# Patient Record
Sex: Female | Born: 1985 | Race: White | Hispanic: No | Marital: Single | Smoking: Current every day smoker
Health system: Southern US, Community
[De-identification: ages and names within clinical notes are randomized; demographics above are authoritative.]

---

## 2005-07-14 ENCOUNTER — Emergency Department: Payer: Self-pay | Admitting: Emergency Medicine

## 2007-06-25 ENCOUNTER — Emergency Department: Payer: Self-pay | Admitting: Emergency Medicine

## 2007-06-29 ENCOUNTER — Emergency Department: Payer: Self-pay | Admitting: Emergency Medicine

## 2007-12-23 ENCOUNTER — Emergency Department: Payer: Self-pay | Admitting: Emergency Medicine

## 2016-08-04 ENCOUNTER — Encounter (HOSPITAL_COMMUNITY): Payer: Self-pay | Admitting: *Deleted

## 2016-08-04 ENCOUNTER — Emergency Department (HOSPITAL_COMMUNITY)
Admission: EM | Admit: 2016-08-04 | Discharge: 2016-08-04 | Disposition: A | Discharge status: Home / Self Care | Payer: Self-pay | Attending: Emergency Medicine | Admitting: Emergency Medicine

## 2016-08-04 ENCOUNTER — Emergency Department (HOSPITAL_COMMUNITY): Payer: Self-pay

## 2016-08-04 DIAGNOSIS — F17200 Nicotine dependence, unspecified, uncomplicated: Secondary | ICD-10-CM | POA: Insufficient documentation

## 2016-08-04 DIAGNOSIS — R300 Dysuria: Secondary | ICD-10-CM | POA: Insufficient documentation

## 2016-08-04 DIAGNOSIS — M545 Low back pain: Secondary | ICD-10-CM | POA: Insufficient documentation

## 2016-08-04 LAB — URINALYSIS, ROUTINE W REFLEX MICROSCOPIC
Bilirubin Urine: NEGATIVE
Glucose, UA: NEGATIVE mg/dL
Ketones, ur: NEGATIVE mg/dL
Leukocytes, UA: NEGATIVE
Nitrite: NEGATIVE
Protein, ur: NEGATIVE mg/dL
Specific Gravity, Urine: 1.005 (ref 1.005–1.030)
pH: 8 (ref 5.0–8.0)

## 2016-08-04 LAB — PREGNANCY, URINE: Preg Test, Ur: NEGATIVE

## 2016-08-04 MED ORDER — IBUPROFEN 800 MG PO TABS: 800.0000 mg | ORAL_TABLET | Freq: Three times a day (TID) | ORAL | 0 refills | Status: DC

## 2016-08-04 MED ORDER — CYCLOBENZAPRINE HCL 10 MG PO TABS: 10.0000 mg | ORAL_TABLET | Freq: Two times a day (BID) | ORAL | 0 refills | Status: DC | PRN

## 2016-08-04 NOTE — ED Notes (Signed)
Patient transported to X-ray 

## 2016-08-04 NOTE — ED Triage Notes (Signed)
Pt reports bilateral lower back pain that radiates all the way across and down bilateral legs. Denies injury. Denies urinary symptoms. Ambulatory at triage.

## 2016-08-04 NOTE — ED Notes (Signed)
Pt is in stable condition upon d/c and ambulates from ED. 

## 2016-08-04 NOTE — Discharge Instructions (Signed)
Medications: ibuprofen, Flexeril  Treatment: Take Flexeril twice daily as needed for muscle pain and spasms. Do not drive or operate machinery when taking this medication. Take ibuprofen every 8 hours for your pain. Use heat and ice alternating 20 minutes on, 20 minutes off. Attempts the stretches attached as tolerated 1-2 times daily.  Follow-up: Please follow-up and establish primary care provider as soon as possible if your symptoms are not improving. Please return the emergency department if he develop any new or worsening symptoms.

## 2016-08-04 NOTE — ED Provider Notes (Signed)
[facility]-EMERGENCY DEPT Provider Note   CSN: [id number] Arrival date & time: 08/04/16  1008  By signing my name below, I, Placido SouLogan Joldersma, attest that this documentation has been prepared under the direction and in the presence of [employer] Electric[employer]andra Nerida Boivin, PA-C.  Electronically Signed: Placido SouLogan Joldersma, ED Scribe. 08/04/16. 10:35 AM.    History   Chief Complaint Chief Complaint  Patient presents with  . Back Pain    HPI HPI Comments: Aimee Perez is a 30 y.o. female who presents to the Emergency Department complaining of moderate, waxing and waning, lower back pain x 5 days. She describes her pain as a throbbing and states it radiates through her bilateral gluteal region and down her posterior legs. Pt states she experienced mild urinary pressure at the onset of her back pain as well as increased urinary frequency. Her pain worsens with movement and ambulation and is worst when lying flat. She is taking 600-800 mg ibuprofen prn which provides mild short term relief. Pt denies a h/o CA or IVDA and denies any recent surgeries. Pt denies any recent heavy lifting or trauma to the region. She denies any saddle anesthesia, bowel/bladder incontinence, dysuria, diaphoresis, fevers, chills, CP, SOB, abdominal pain, nausea, vomiting, diarrhea of other associated symptoms at this time.   The history is provided by the patient and medical records. No language interpreter was used.    History reviewed. No pertinent past medical history.  There are no active problems to display for this patient.   History reviewed. No pertinent surgical history.  OB History    No data available       Home Medications    Prior to Admission medications   Medication Sig Start Date End Date Taking? Authorizing Provider  cyclobenzaprine (FLEXERIL) 10 MG tablet Take 1 tablet (10 mg total) by mouth 2 (two) times daily as needed for muscle spasms. 08/04/16   Emi HolesAlexandra M Liela Rylee, PA-C  ibuprofen (ADVIL,MOTRIN) 800 MG tablet  Take 1 tablet (800 mg total) by mouth 3 (three) times daily. 08/04/16   Emi HolesAlexandra M Yvonne Petite, PA-C    Family History History reviewed. No pertinent family history.  Social History Social History  Substance Use Topics  . Smoking status: Current Every Day Smoker  . Smokeless tobacco: Not on file  . Alcohol use Yes     Comment: occ     Allergies   Patient has no known allergies.   Review of Systems Review of Systems  Constitutional: Negative for chills and fever.  HENT: Negative for facial swelling and sore throat.   Respiratory: Negative for shortness of breath.   Cardiovascular: Negative for chest pain.  Gastrointestinal: Negative for abdominal pain, nausea and vomiting.  Genitourinary: Positive for frequency. Negative for difficulty urinating and dysuria.  Musculoskeletal: Positive for back pain and myalgias. Negative for neck pain and neck stiffness.  Skin: Negative for rash and wound.  Neurological: Negative for headaches.  Psychiatric/Behavioral: The patient is not nervous/anxious.    Physical Exam Updated Vital Signs BP (!) 129/103 (BP Location: Left Arm)   Pulse 78   Temp 98.8 F (37.1 C) (Oral)   Resp 17   Ht 5\' 6"  (1.676 m)   Wt 77.1 kg   LMP 07/21/2016   SpO2 100%   BMI 27.44 kg/m   Physical Exam  Constitutional: She appears well-developed and well-nourished. No distress.  HENT:  Head: Normocephalic and atraumatic.  Mouth/Throat: Oropharynx is clear and moist. No oropharyngeal exudate.  Eyes: Conjunctivae are normal. Pupils are equal, round,  and reactive to light. Right eye exhibits no discharge. Left eye exhibits no discharge. No scleral icterus.  Neck: Normal range of motion. Neck supple. No thyromegaly present.  Cardiovascular: Normal rate, regular rhythm, normal heart sounds and intact distal pulses.  Exam reveals no gallop and no friction rub.   No murmur heard. Pulmonary/Chest: Effort normal and breath sounds normal. No stridor. No respiratory  distress. She has no wheezes. She has no rales.  Abdominal: Soft. Bowel sounds are normal. She exhibits no distension. There is no tenderness. There is no rebound and no guarding.  Musculoskeletal: She exhibits tenderness. She exhibits no edema.       Lumbar back: She exhibits tenderness. She exhibits no bony tenderness.  TTP to bilateral lumbar musculature and bilateral gluteal musculature. Pain worsens with lumbar flexion and extension. Good ROM.   Lymphadenopathy:    She has no cervical adenopathy.  Neurological: She is alert. She has normal strength. No sensory deficit. Coordination normal.  Reflex Scores:      Patellar reflexes are 2+ on the right side and 2+ on the left side. Strength and sensation intact in the BLEs  Skin: Skin is warm and dry. No rash noted. She is not diaphoretic. No pallor.  Psychiatric: She has a normal mood and affect.  Nursing note and vitals reviewed.  ED Treatments / Results  Labs (all labs ordered are listed, but only abnormal results are displayed) Labs Reviewed  URINALYSIS, ROUTINE W REFLEX MICROSCOPIC - Abnormal; Notable for the following:       Result Value   Hgb urine dipstick SMALL (*)    Bacteria, UA RARE (*)    Squamous Epithelial / LPF 0-5 (*)    All other components within normal limits  URINE CULTURE  PREGNANCY, URINE    EKG  EKG Interpretation None       Radiology Dg Lumbar Spine Complete  Result Date: 08/04/2016 CLINICAL DATA:  Back pain EXAM: LUMBAR SPINE - COMPLETE 4+ VIEW COMPARISON:  None. FINDINGS: Five views of the lumbar spine submitted. No acute fracture or subluxation. Alignment and vertebral body heights are preserved. Minimal anterior spurring lower endplate of L3 and upper endplate of L5 vertebral body. Mild disc space flattening at L5-S1 level. IMPRESSION: No acute fracture or subluxation.  Minimal degenerative changes. Electronically Signed   By: Natasha MeadLiviu  Pop M.D.   On: 08/04/2016 11:37    Procedures Procedures    DIAGNOSTIC STUDIES: Oxygen Saturation is 100% on RA, normal by my interpretation.    COORDINATION OF CARE: 10:33 AM Discussed next steps with pt. Pt verbalized understanding and is agreeable with the plan.    Medications Ordered in ED Medications - No data to display   Initial Impression / Assessment and Plan / ED Course  I have reviewed the triage vital signs and the nursing notes.  Pertinent labs & imaging results that were available during my care of the patient were reviewed by me and considered in my medical decision making (see chart for details).  Clinical Course     Patient with back pain.  No neurological deficits and normal neuro exam.  Patient is ambulatory.  No loss of bowel or bladder control.  No concern for cauda equina.  No fever, night sweats, weight loss, h/o cancer, IVDA, no recent procedure to back. UA shows small hematuria, rare bacteria. Urine culture sent. Supportive care and return precaution discussed. Patient understands and agrees with plan. Appears safe for discharge at this time. Follow up and establish  care with PCP. Patient vitals stable throughout ED course and discharged in satisfactory condition.   Final Clinical Impressions(s) / ED Diagnoses   Final diagnoses:  Acute bilateral low back pain, with sciatica presence unspecified   I personally performed the services described in this documentation, which was scribed in my presence. The recorded information has been reviewed and is accurate.   New Prescriptions New Prescriptions   CYCLOBENZAPRINE (FLEXERIL) 10 MG TABLET    Take 1 tablet (10 mg total) by mouth 2 (two) times daily as needed for muscle spasms.   IBUPROFEN (ADVIL,MOTRIN) 800 MG TABLET    Take 1 tablet (800 mg total) by mouth 3 (three) times daily.     Emi Holes, PA-C 08/04/16 1158    Benjiman Core, MD 08/04/16 [phone]

## 2016-08-05 LAB — URINE CULTURE: Special Requests: NORMAL

## 2018-04-28 IMAGING — CR DG LUMBAR SPINE COMPLETE 4+V
5 series · 5 of 5 positions shown · non-contrast
Comparison: None.

CLINICAL DATA: Back pain

EXAM:
LUMBAR SPINE - COMPLETE 4+ VIEW

[l-spine ap]
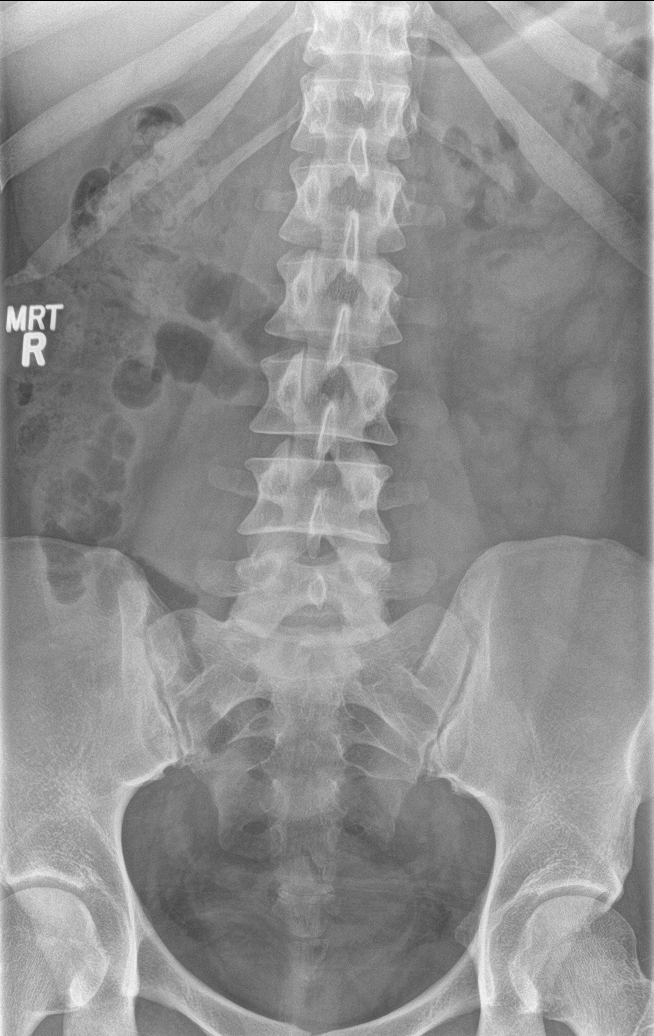

[l-spine obl (1 of 2)]
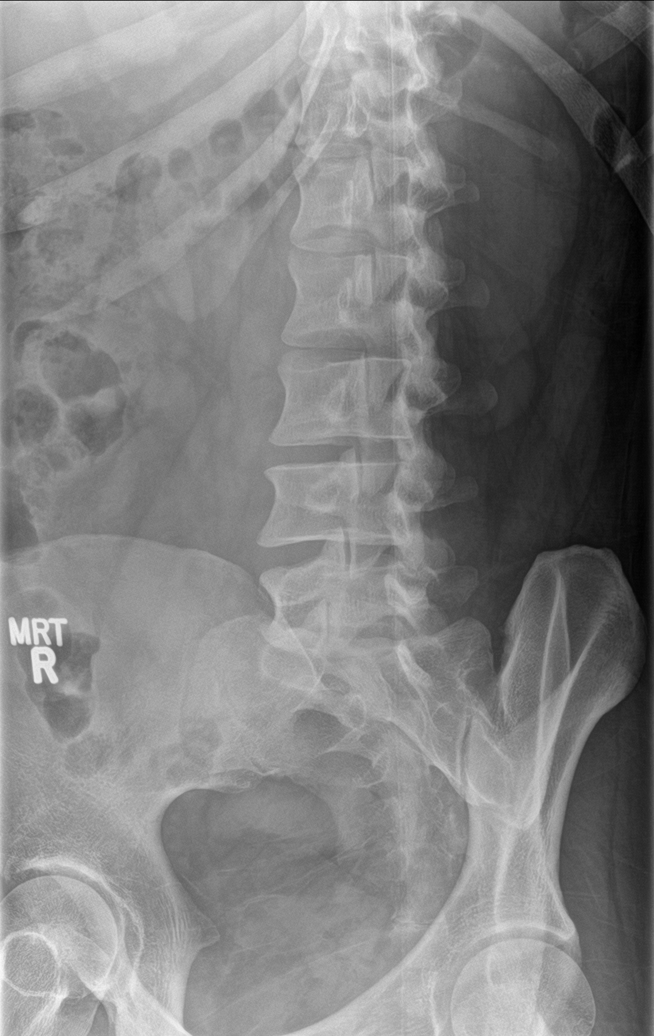

[l-spine obl (2 of 2)]
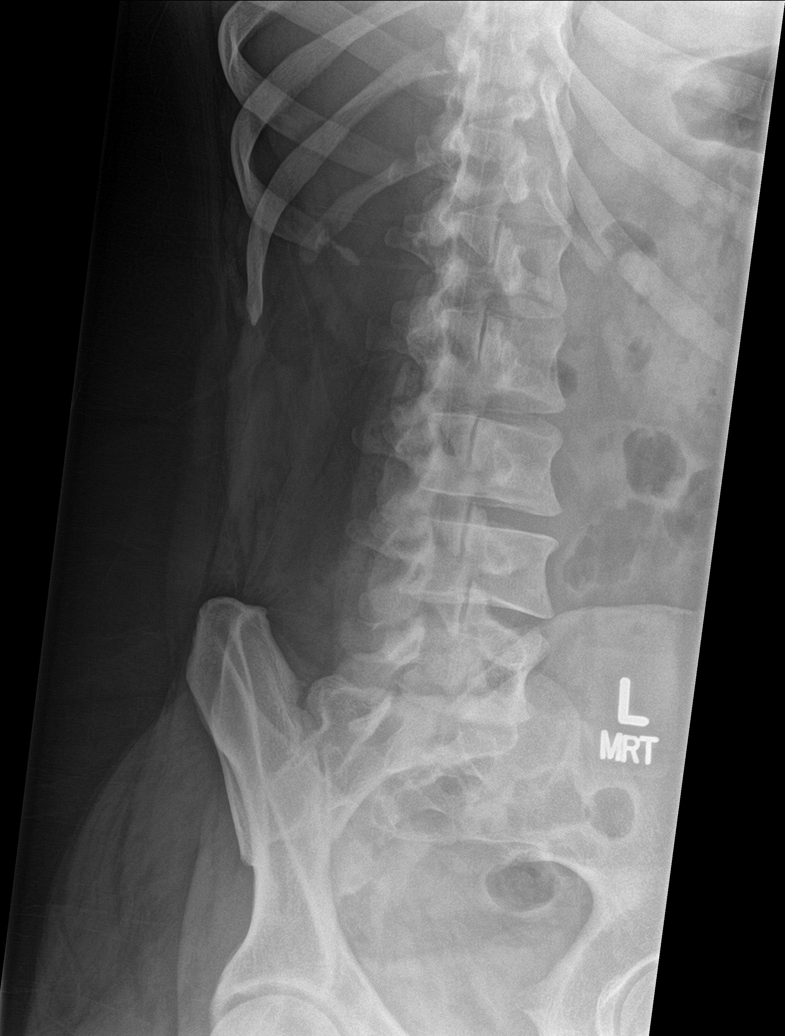

[l-spine lat]
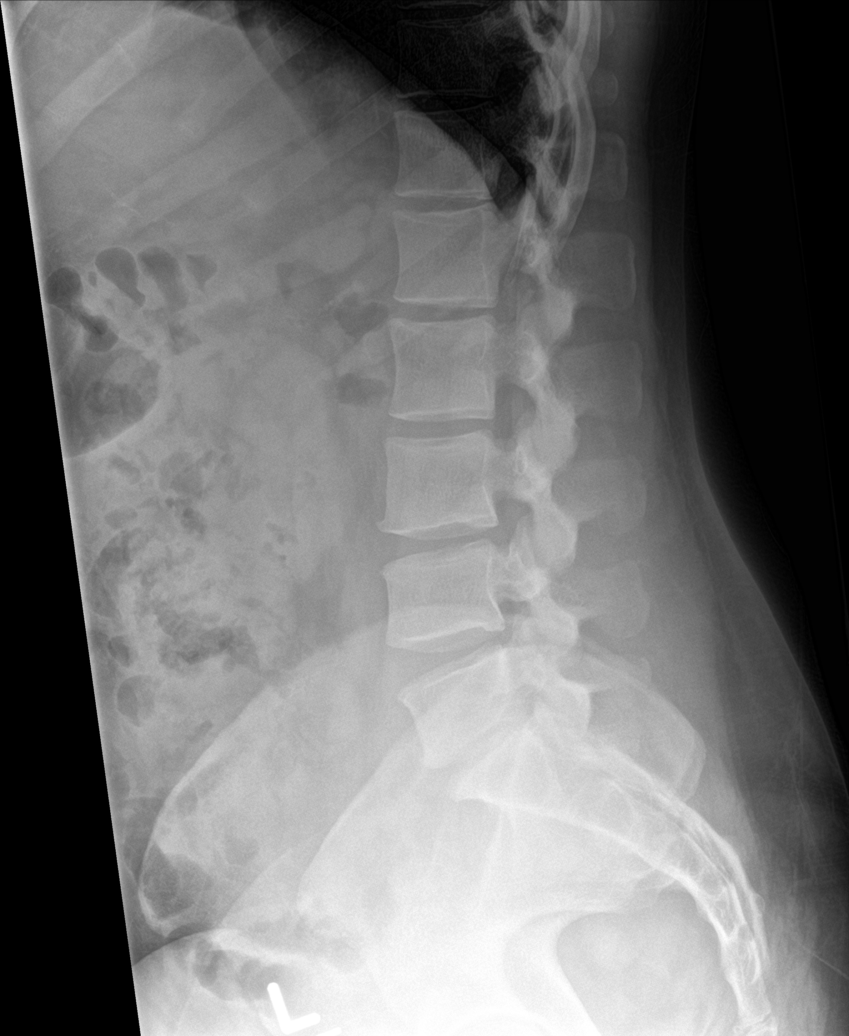

[l-spine spot]
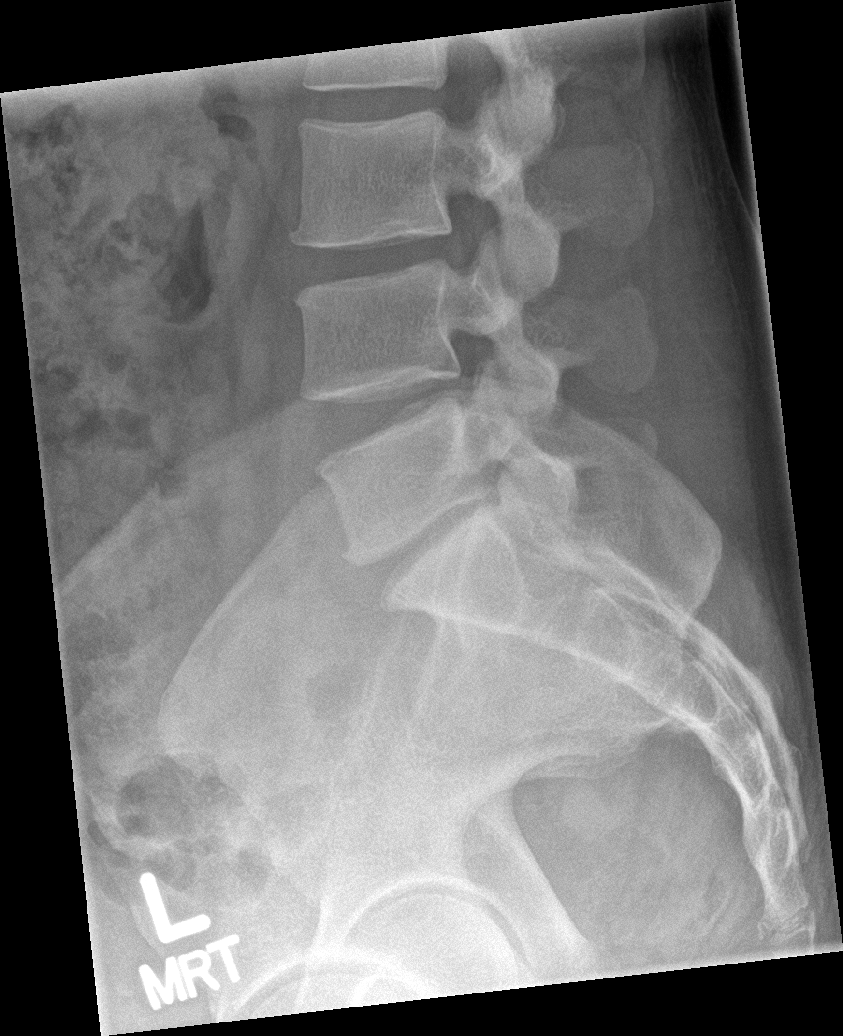

[5 of 5 positions shown; findings below may reference images not displayed]

FINDINGS: Five views of the lumbar spine submitted. No acute fracture or
subluxation. Alignment and vertebral body heights are preserved.
Minimal anterior spurring lower endplate of L3 and upper endplate of
L5 vertebral body. Mild disc space flattening at L5-S1 level.
IMPRESSION: No acute fracture or subluxation.  Minimal degenerative changes.

## 2020-04-25 ENCOUNTER — Other Ambulatory Visit: Payer: Self-pay

## 2020-04-25 ENCOUNTER — Ambulatory Visit
Admission: EM | Admit: 2020-04-25 | Discharge: 2020-04-25 | Disposition: A | Discharge status: Home / Self Care | Payer: Self-pay | Attending: Family Medicine | Admitting: Family Medicine

## 2020-04-25 ENCOUNTER — Ambulatory Visit (HOSPITAL_COMMUNITY): Admit: 2020-04-25 | Discharge status: Absent without leave | Payer: Self-pay

## 2020-04-25 DIAGNOSIS — R109 Unspecified abdominal pain: Secondary | ICD-10-CM

## 2020-04-25 DIAGNOSIS — R822 Biliuria: Secondary | ICD-10-CM

## 2020-04-25 LAB — POCT URINALYSIS DIP (MANUAL ENTRY)
Bilirubin, UA: NEGATIVE
Glucose, UA: NEGATIVE mg/dL
Nitrite, UA: NEGATIVE
Protein Ur, POC: NEGATIVE mg/dL
Spec Grav, UA: 1.02 (ref 1.010–1.025)
Urobilinogen, UA: 0.2 E.U./dL
pH, UA: 6.5 (ref 5.0–8.0)

## 2020-04-25 MED ORDER — IBUPROFEN 800 MG PO TABS: 800.0000 mg | ORAL_TABLET | Freq: Three times a day (TID) | ORAL | 0 refills | Status: AC | PRN

## 2020-04-25 MED ORDER — CYCLOBENZAPRINE HCL 10 MG PO TABS: 10.0000 mg | ORAL_TABLET | Freq: Two times a day (BID) | ORAL | 0 refills | Status: AC | PRN

## 2020-04-25 NOTE — ED Provider Notes (Addendum)
[facility]   [id number] 04/25/20 Arrival Time: 1108  QP:[id number] PAIN  SUBJECTIVE: History from: patient. Aimee Perez is a 34 y.o. female complains of left flank pain that began about 1-2 weeks ago. Denies a precipitating event or specific injury. Reports that the pain is intermittent and somewhat dependent on position. Has tried OTC medications without relief. States that pain is deeper than what she can feel on the outside. Has had her significant other try to massage the area with no relief. Reports that she was drinking heavily for the last 6 months or so. States that symptoms are worse when she has alcohol. Symptoms are made worse with activity. Denies similar symptoms in the past. Denies fever, chills, erythema, ecchymosis, effusion, weakness, numbness and tingling, saddle paresthesias, loss of bowel or bladder function.      ROS: As per HPI.  All other pertinent ROS negative.     History reviewed. No pertinent past medical history. History reviewed. No pertinent surgical history. No Known Allergies No current facility-administered medications on file prior to encounter.   No current outpatient medications on file prior to encounter.   Social History   Socioeconomic History  . Marital status: Single    Spouse name: Not on file  . Number of children: Not on file  . Years of education: Not on file  . Highest education level: Not on file  Occupational History  . Not on file  Tobacco Use  . Smoking status: Current Every Day Smoker    Packs/day: 0.50    Years: 15.00    Pack years: 7.50  . Smokeless tobacco: Never Used  Vaping Use  . Vaping Use: Never used  Substance and Sexual Activity  . Alcohol use: Yes    Comment: occ  . Drug use: Yes    Frequency: 1.0 times per week    Types: Marijuana  . Sexual activity: Yes    Birth control/protection: None  Other Topics Concern  . Not on file  Social History Narrative  . Not on file   Social Determinants of  Health   Financial Resource Strain:   . Difficulty of Paying Living Expenses: Not on file  Food Insecurity:   . Worried About Programme researcher, broadcasting/film/video in the Last Year: Not on file  . Ran Out of Food in the Last Year: Not on file  Transportation Needs:   . Lack of Transportation (Medical): Not on file  . Lack of Transportation (Non-Medical): Not on file  Physical Activity:   . Days of Exercise per Week: Not on file  . Minutes of Exercise per Session: Not on file  Stress:   . Feeling of Stress : Not on file  Social Connections:   . Frequency of Communication with Friends and Family: Not on file  . Frequency of Social Gatherings with Friends and Family: Not on file  . Attends Religious Services: Not on file  . Active Member of Clubs or Organizations: Not on file  . Attends Banker Meetings: Not on file  . Marital Status: Not on file  Intimate Partner Violence:   . Fear of Current or Ex-Partner: Not on file  . Emotionally Abused: Not on file  . Physically Abused: Not on file  . Sexually Abused: Not on file   Family History  Problem Relation Age of Onset  . Diabetes Mother   . Stroke Father     OBJECTIVE:  Vitals:   04/25/20 1119  BP: 135/89  Pulse:  68  Resp: 18  Temp: 98.7 F (37.1 C)  TempSrc: Oral  SpO2: 97%    General appearance: ALERT; in no acute distress.  Head: NCAT Lungs: Normal respiratory effort CV:  pulses 2+ bilaterally. Cap refill < 2 seconds Musculoskeletal:  Inspection: Skin warm, dry, clear and intact without obvious erythema, effusion, or ecchymosis.  Palpation: Nontender to palpation ROM: FROM active and passive Skin: warm and dry Neurologic: Ambulates without difficulty; Sensation intact about the upper/ lower extremities Psychological: alert and cooperative; normal mood and affect  DIAGNOSTIC STUDIES:  No results found.   ASSESSMENT & PLAN:  1. Left flank pain   2. Bilirubinuria    UA not remarkable Will culture to rule out  infection Ketones present CBC, CMP, lipase pending Will call with abnormal results and treat accordingly Unsure of etiology of pain, will check labs to rule out organic causes Continue conservative management of rest, ice, and gentle stretches Take ibuprofen as needed for pain relief (may cause abdominal discomfort, ulcers, and GI bleeds avoid taking with other NSAIDs) Take cyclobenzaprine at nighttime for symptomatic relief. Avoid driving or operating heavy machinery while using medication. Follow up with PCP if symptoms persist Return or go to the ER if you have any new or worsening symptoms (fever, chills, chest pain, abdominal pain, changes in bowel or bladder habits, pain radiating into lower legs)   Reviewed expectations re: course of current medical issues. Questions answered. Outlined signs and symptoms indicating need for more acute intervention. Patient verbalized understanding. After Visit Summary given.       Moshe Cipro, NP 04/25/20 1319    Moshe Cipro, NP 04/25/20 1320

## 2020-04-25 NOTE — ED Triage Notes (Signed)
Pt presents with pain in back - left sided lower rib area x 1-2 weeks.  Noticeably worsened x 6 days ago (did have 6 beers that day). Feels like a deep burning pain that throbs and flutters after eating or laying down.    Has had a few waves of mild nausea.  Normal BMs with maybe some loose stool a couple days ago.  No urinary frequency, odor, blood, pain.  Is spotting d/t starting period.

## 2020-04-25 NOTE — Discharge Instructions (Addendum)
We are going to check some labs for you today  We will also culture your urine  We will call you with any abnormal results and treat as needed  For now, we will also treat this as muscular  Take ibuprofen as needed for your pain.    Take the muscle relaxer Flexeril as needed for muscle spasm; Do not drive, operate machinery, or drink alcohol with this medication as it may make you drowsy.    Follow up with your primary care provider or an orthopedist if your pain is not improving.

## 2020-04-26 LAB — COMPREHENSIVE METABOLIC PANEL
ALT: 26 IU/L (ref 0–32)
AST: 15 IU/L (ref 0–40)
Albumin/Globulin Ratio: 2 (ref 1.2–2.2)
Albumin: 4.9 g/dL — ABNORMAL HIGH (ref 3.8–4.8)
Alkaline Phosphatase: 66 IU/L (ref 48–121)
BUN/Creatinine Ratio: 12 (ref 9–23)
BUN: 8 mg/dL (ref 6–20)
Bilirubin Total: 0.3 mg/dL (ref 0.0–1.2)
CO2: 20 mmol/L (ref 20–29)
Calcium: 9.2 mg/dL (ref 8.7–10.2)
Chloride: 104 mmol/L (ref 96–106)
Creatinine, Ser: 0.67 mg/dL (ref 0.57–1.00)
GFR calc Af Amer: 134 mL/min/{1.73_m2} (ref >59)
GFR calc non Af Amer: 116 mL/min/{1.73_m2} (ref >59)
Globulin, Total: 2.4 g/dL (ref 1.5–4.5)
Glucose: 124 mg/dL — ABNORMAL HIGH (ref 65–99)
Potassium: 4.1 mmol/L (ref 3.5–5.2)
Sodium: 138 mmol/L (ref 134–144)
Total Protein: 7.3 g/dL (ref 6.0–8.5)

## 2020-04-26 LAB — CBC WITH DIFFERENTIAL/PLATELET
Basophils Absolute: 0.1 10*3/uL (ref 0.0–0.2)
Basos: 1 %
EOS (ABSOLUTE): 0.1 10*3/uL (ref 0.0–0.4)
Eos: 1 %
Hematocrit: 43.6 % (ref 34.0–46.6)
Hemoglobin: 14.6 g/dL (ref 11.1–15.9)
Immature Grans (Abs): 0 10*3/uL (ref 0.0–0.1)
Immature Granulocytes: 0 %
Lymphocytes Absolute: 2.6 10*3/uL (ref 0.7–3.1)
Lymphs: 24 %
MCH: 31.9 pg (ref 26.6–33.0)
MCHC: 33.5 g/dL (ref 31.5–35.7)
MCV: 95 fL (ref 79–97)
Monocytes Absolute: 0.6 10*3/uL (ref 0.1–0.9)
Monocytes: 6 %
Neutrophils Absolute: 7.5 10*3/uL — ABNORMAL HIGH (ref 1.4–7.0)
Neutrophils: 68 %
Platelets: 186 10*3/uL (ref 150–450)
RBC: 4.57 x10E6/uL (ref 3.77–5.28)
RDW: 13.1 % (ref 11.7–15.4)
WBC: 10.9 10*3/uL — ABNORMAL HIGH (ref 3.4–10.8)

## 2020-04-26 LAB — LIPASE: Lipase: 27 U/L (ref 14–72)

## 2020-04-27 ENCOUNTER — Emergency Department
Admission: EM | Admit: 2020-04-27 | Discharge: 2020-04-27 | Disposition: A | Discharge status: Home / Self Care | Payer: Self-pay | Attending: Student in an Organized Health Care Education/Training Program | Admitting: Student in an Organized Health Care Education/Training Program

## 2020-04-27 ENCOUNTER — Emergency Department: Payer: Self-pay

## 2020-04-27 ENCOUNTER — Other Ambulatory Visit: Payer: Self-pay

## 2020-04-27 DIAGNOSIS — R109 Unspecified abdominal pain: Secondary | ICD-10-CM | POA: Insufficient documentation

## 2020-04-27 DIAGNOSIS — F17200 Nicotine dependence, unspecified, uncomplicated: Secondary | ICD-10-CM | POA: Insufficient documentation

## 2020-04-27 LAB — COMPREHENSIVE METABOLIC PANEL
ALT: 28 U/L (ref 0–44)
AST: 18 U/L (ref 15–41)
Albumin: 4.8 g/dL (ref 3.5–5.0)
Alkaline Phosphatase: 63 U/L (ref 38–126)
Anion gap: 12 (ref 5–15)
BUN: 6 mg/dL (ref 6–20)
CO2: 23 mmol/L (ref 22–32)
Calcium: 9.3 mg/dL (ref 8.9–10.3)
Chloride: 104 mmol/L (ref 98–111)
Creatinine, Ser: 0.58 mg/dL (ref 0.44–1.00)
GFR calc Af Amer: >60 mL/min (ref >60)
GFR calc non Af Amer: >60 mL/min (ref >60)
Glucose, Bld: 106 mg/dL — ABNORMAL HIGH (ref 70–99)
Potassium: 4.2 mmol/L (ref 3.5–5.1)
Sodium: 139 mmol/L (ref 135–145)
Total Bilirubin: 0.9 mg/dL (ref 0.3–1.2)
Total Protein: 7.9 g/dL (ref 6.5–8.1)

## 2020-04-27 LAB — CBC
HCT: 44.3 % (ref 36.0–46.0)
Hemoglobin: 15 g/dL (ref 12.0–15.0)
MCH: 31.6 pg (ref 26.0–34.0)
MCHC: 33.9 g/dL (ref 30.0–36.0)
MCV: 93.5 fL (ref 80.0–100.0)
Platelets: 303 10*3/uL (ref 150–400)
RBC: 4.74 MIL/uL (ref 3.87–5.11)
RDW: 12.9 % (ref 11.5–15.5)
WBC: 13.1 10*3/uL — ABNORMAL HIGH (ref 4.0–10.5)
nRBC: 0 % (ref 0.0–0.2)

## 2020-04-27 LAB — URINALYSIS, ROUTINE W REFLEX MICROSCOPIC
Bacteria, UA: NONE SEEN
Bilirubin Urine: NEGATIVE
Glucose, UA: NEGATIVE mg/dL
Ketones, ur: 80 mg/dL — AB
Leukocytes,Ua: NEGATIVE
Nitrite: NEGATIVE
Protein, ur: NEGATIVE mg/dL
Specific Gravity, Urine: 1.01 (ref 1.005–1.030)
pH: 5 (ref 5.0–8.0)

## 2020-04-27 LAB — POCT PREGNANCY, URINE: Preg Test, Ur: NEGATIVE

## 2020-04-27 LAB — LIPASE, BLOOD: Lipase: 20 U/L (ref 11–51)

## 2020-04-27 MED ORDER — HYDROXYZINE HCL 10 MG PO TABS: 10.0000 mg | ORAL_TABLET | Freq: Three times a day (TID) | ORAL | 0 refills | Status: AC | PRN

## 2020-04-27 MED ORDER — KETOROLAC TROMETHAMINE 10 MG PO TABS: 10.0000 mg | ORAL_TABLET | Freq: Four times a day (QID) | ORAL | 0 refills | Status: AC | PRN

## 2020-04-27 MED ORDER — FENTANYL CITRATE (PF) 100 MCG/2ML IJ SOLN
50.0000 ug | Freq: Once | INTRAMUSCULAR | Status: AC
  Administered 2020-04-27: 50 ug via INTRAMUSCULAR
  Filled 2020-04-27: qty 2

## 2020-04-27 MED ORDER — KETOROLAC TROMETHAMINE 30 MG/ML IJ SOLN
30.0000 mg | Freq: Once | INTRAMUSCULAR | Status: AC
  Administered 2020-04-27: 30 mg via INTRAMUSCULAR

## 2020-04-27 MED ORDER — METHOCARBAMOL 500 MG PO TABS: 500.0000 mg | ORAL_TABLET | Freq: Three times a day (TID) | ORAL | 0 refills | Status: AC | PRN

## 2020-04-27 MED ORDER — KETOROLAC TROMETHAMINE 30 MG/ML IJ SOLN
30.0000 mg | Freq: Once | INTRAMUSCULAR | Status: DC
  Filled 2020-04-27: qty 1

## 2020-04-27 MED ORDER — ONDANSETRON 4 MG PO TBDP
4.0000 mg | ORAL_TABLET | Freq: Once | ORAL | Status: AC
  Administered 2020-04-27: 4 mg via ORAL
  Filled 2020-04-27: qty 1

## 2020-04-27 NOTE — ED Triage Notes (Signed)
Pt states that she started with back pain for the past 2 weeks states that she was seen at urgent care on Friday and told she had blood in her urine, pt states today she had more blood in her urine and states that she is having back pain, pt reports that she feels weak and shaky

## 2020-04-27 NOTE — ED Provider Notes (Signed)
Emergency Department Provider Note  ____________________________________________  Time seen: Approximately 5:54 PM  I have reviewed the triage vital signs and the nursing notes.   HISTORY  Chief Complaint Hematuria, Weakness, and Back Pain   Historian Patient    HPI Aimee Perez is a 34 y.o. female presents to the emergency with left-sided flank pain that has occurred intermittently over the past 2 weeks.  Patient reports that her left-sided flank pain worsened acutely over the past 2 days.  She denies dysuria but states that she has developed hematuria.  Patient also reports onset of her menses today.  No increased urinary frequency.  No fever or chills at home.  No falls or mechanisms of trauma.  Denies a history of pyelonephritis or nephrolithiasis in the past.  Patient is tearful due to pain.   No past medical history on file.   Immunizations up to date:  Yes.     No past medical history on file.  There are no problems to display for this patient.   No past surgical history on file.  Prior to Admission medications   Medication Sig Start Date End Date Taking? Authorizing Provider  cyclobenzaprine (FLEXERIL) 10 MG tablet Take 1 tablet (10 mg total) by mouth 2 (two) times daily as needed for muscle spasms. 04/25/20   Moshe Cipro, NP  hydrOXYzine (ATARAX/VISTARIL) 10 MG tablet Take 1 tablet (10 mg total) by mouth 3 (three) times daily as needed. 04/27/20   Orvil Feil, PA-C  ibuprofen (ADVIL) 800 MG tablet Take 1 tablet (800 mg total) by mouth every 8 (eight) hours as needed for moderate pain. 04/25/20   Moshe Cipro, NP  ketorolac (TORADOL) 10 MG tablet Take 1 tablet (10 mg total) by mouth every 6 (six) hours as needed for up to 5 days. 04/27/20 05/02/20  Orvil Feil, PA-C  methocarbamol (ROBAXIN) 500 MG tablet Take 1 tablet (500 mg total) by mouth every 8 (eight) hours as needed for up to 5 days. 04/27/20 05/02/20  Orvil Feil, PA-C     Allergies Patient has no known allergies.  Family History  Problem Relation Age of Onset   Diabetes Mother    Stroke Father     Social History Social History   Tobacco Use   Smoking status: Current Every Day Smoker    Packs/day: 0.50    Years: 15.00    Pack years: 7.50   Smokeless tobacco: Never Used  Building services engineer Use: Never used  Substance Use Topics   Alcohol use: Yes    Comment: occ   Drug use: Yes    Frequency: 1.0 times per week    Types: Marijuana     Review of Systems  Constitutional: No fever/chills Eyes:  No discharge ENT: No upper respiratory complaints. Respiratory: no cough. No SOB/ use of accessory muscles to breath Gastrointestinal: Patient has flank pain.  Musculoskeletal: Negative for musculoskeletal pain. Skin: Negative for rash, abrasions, lacerations, ecchymosis.    ____________________________________________   PHYSICAL EXAM:  VITAL SIGNS: ED Triage Vitals  Enc Vitals Group     BP 04/27/20 1426 (!) 146/86     Pulse Rate 04/27/20 1426 (!) 101     Resp 04/27/20 1426 16     Temp 04/27/20 1426 98.4 F (36.9 C)     Temp Source 04/27/20 1426 Oral     SpO2 04/27/20 1426 99 %     Weight 04/27/20 1427 180 lb (81.6 kg)     Height 04/27/20  1427 5\' 6"  (1.676 m)     Head Circumference --      Peak Flow --      Pain Score 04/27/20 1426 7     Pain Loc --      Pain Edu? --      Excl. in GC? --      Constitutional: Alert and oriented. Well appearing and in no acute distress. Eyes: Conjunctivae are normal. PERRL. EOMI. Head: Atraumatic. Cardiovascular: Normal rate, regular rhythm. Normal S1 and S2.  Good peripheral circulation. Respiratory: Normal respiratory effort without tachypnea or retractions. Lungs CTAB. Good air entry to the bases with no decreased or absent breath sounds Gastrointestinal: Bowel sounds x 4 quadrants. Soft and nontender to palpation. No guarding or rigidity. No distention.  Patient has CVA tenderness  on the left. Musculoskeletal: Full range of motion to all extremities. No obvious deformities noted Neurologic:  Normal for age. No gross focal neurologic deficits are appreciated.  Skin:  Skin is warm, dry and intact. No rash noted. Psychiatric: Mood and affect are normal for age. Speech and behavior are normal.   ____________________________________________   LABS (all labs ordered are listed, but only abnormal results are displayed)  Labs Reviewed  COMPREHENSIVE METABOLIC PANEL - Abnormal; Notable for the following components:      Result Value   Glucose, Bld 106 (*)    All other components within normal limits  CBC - Abnormal; Notable for the following components:   WBC 13.1 (*)    All other components within normal limits  URINALYSIS, ROUTINE W REFLEX MICROSCOPIC - Abnormal; Notable for the following components:   Color, Urine YELLOW (*)    APPearance CLEAR (*)    Hgb urine dipstick LARGE (*)    Ketones, ur 80 (*)    All other components within normal limits  URINE CULTURE  LIPASE, BLOOD  POC URINE PREG, ED  POCT PREGNANCY, URINE   ____________________________________________  EKG   ____________________________________________  RADIOLOGY 06/27/20, personally viewed and evaluated these images (plain radiographs) as part of my medical decision making, as well as reviewing the written report by the radiologist.    CT Renal Stone Study  Result Date: 04/27/2020 CLINICAL DATA:  Flank pain, kidney stone suspected Patient reports back pain for 2 weeks. EXAM: CT ABDOMEN AND PELVIS WITHOUT CONTRAST TECHNIQUE: Multidetector CT imaging of the abdomen and pelvis was performed following the standard protocol without IV contrast. COMPARISON:  None. FINDINGS: Lower chest: The lung bases are clear. No pleural fluid or pneumothorax. Hepatobiliary: Mild diffusely decreased hepatic density consistent with steatosis. No evidence of focal lesion on noncontrast exam. Gallbladder  physiologically distended, no calcified stone. No biliary dilatation. Pancreas: No ductal dilatation or inflammation. Spleen: Normal in size without focal abnormality. Adrenals/Urinary Tract: Normal adrenal glands. No hydronephrosis. No perinephric edema. No renal calculi. No evidence of focal renal mass. Both ureters are decompressed without stones along the course. The urinary bladder is nondistended and not well evaluated. No bladder stone. Stomach/Bowel: Stomach is unremarkable. There is no small bowel obstruction or evidence of inflammation. Normal appendix. Mild submucosal fatty infiltration involving the ascending colon. No colonic wall thickening or acute inflammatory change. Small volume of colonic stool. Vascular/Lymphatic: Normal caliber abdominal aorta. Occasional periportal nodes are likely reactive, not enlarged by size criteria. No suspicious adenopathy. Reproductive: Posterior left uterus slightly bulbous and may represent underlying fibroids. Symmetric sized ovaries without adnexal mass. Other: No free air or free fluid.  No abdominal wall hernia.  Musculoskeletal: Degenerative disc disease at L5-S1 with disc space narrowing, endplate spurring, and mild endplate sclerosis. No pars defects. There are no acute or suspicious osseous abnormalities. IMPRESSION: 1. No renal stones or obstructive uropathy. No acute findings in the abdomen/pelvis. 2. Mild hepatic steatosis. 3. Possible small uterine fibroid. 4. Degenerative disc disease at L5-S1. Electronically Signed   By: Narda Rutherford M.D.   On: 04/27/2020 19:14    ____________________________________________    PROCEDURES  Procedure(s) performed:     Procedures     Medications  fentaNYL (SUBLIMAZE) injection 50 mcg (50 mcg Intramuscular Given 04/27/20 1742)  ondansetron (ZOFRAN-ODT) disintegrating tablet 4 mg (4 mg Oral Given 04/27/20 1742)  ketorolac (TORADOL) 30 MG/ML injection 30 mg (30 mg Intramuscular Given 04/27/20 2022)      ____________________________________________   INITIAL IMPRESSION / ASSESSMENT AND PLAN / ED COURSE  Pertinent labs & imaging results that were available during my care of the patient were reviewed by me and considered in my medical decision making (see chart for details).    Assessment and Plan:  Flank pain 34 year old female presents to the emergency department with left-sided flank pain that is occurred intermittently over the past week.  Patient was mildly tachycardic at triage.  On physical exam, she was resting comfortably with no increased work of breathing.  She denied shortness of breath, chest tightness or chest pain.  CBC and CMP were reassuring.  Urine pregnancy testing was negative.  Lipase was within reference range.  Urinalysis revealed a large amount of blood consistent with patient's current menses.  CT renal stone study revealed no evidence of nephrolithiasis.  Will give Toradol in the emergency department.  Plan to discharge patient home with Toradol and Robaxin.  Return precautions were given to return with new or worsening symptoms.   ____________________________________________  FINAL CLINICAL IMPRESSION(S) / ED DIAGNOSES  Final diagnoses:  Flank pain      NEW MEDICATIONS STARTED DURING THIS VISIT:  ED Discharge Orders         Ordered    ketorolac (TORADOL) 10 MG tablet  Every 6 hours PRN        04/27/20 2025    methocarbamol (ROBAXIN) 500 MG tablet  Every 8 hours PRN        04/27/20 2025    hydrOXYzine (ATARAX/VISTARIL) 10 MG tablet  3 times daily PRN        04/27/20 2026              This chart was dictated using voice recognition software/Dragon. Despite best efforts to proofread, errors can occur which can change the meaning. Any change was purely unintentional.     Gasper Lloyd 04/27/20 2028    Willy Eddy, MD 04/27/20 2126

## 2020-04-28 LAB — URINE CULTURE

## 2020-04-30 LAB — URINE CULTURE: Culture: NO GROWTH

## 2022-01-19 IMAGING — CT CT RENAL STONE PROTOCOL
2 of 4 series · 16 of 46 positions shown, 18 images · non-contrast
Comparison: None.

CLINICAL DATA: Flank pain, kidney stone suspected

Patient reports back pain for 2 weeks.
EXAM:
CT ABDOMEN AND PELVIS WITHOUT CONTRAST
TECHNIQUE: Multidetector CT imaging of the abdomen and pelvis was performed
following the standard protocol without IV contrast.

[Series 2: stone full standard · axial · 0.89mm/px · z∈[-979,-519]mm · 13 of 102 slices shown, 15 images]
[im 5/102  soft-tissue]
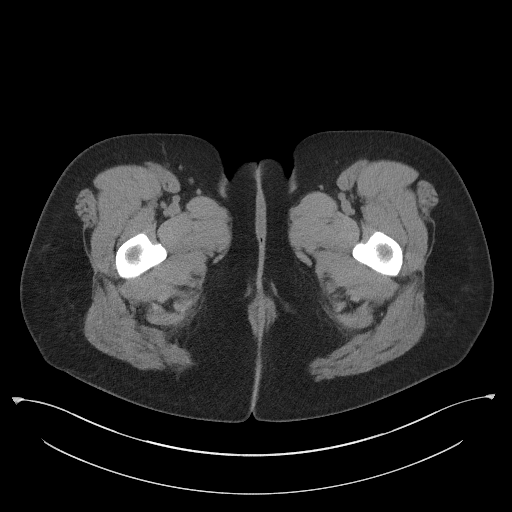
[im 5/102  bone]
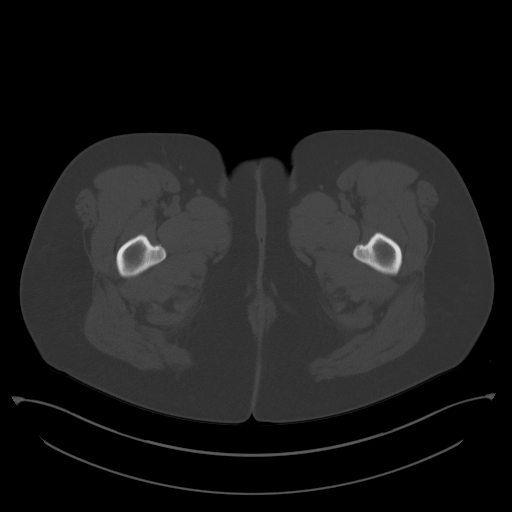
[im 13/102  soft-tissue]
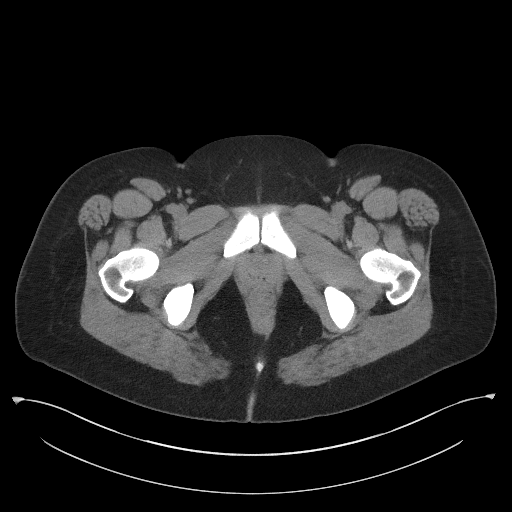
[im 22/102  soft-tissue]
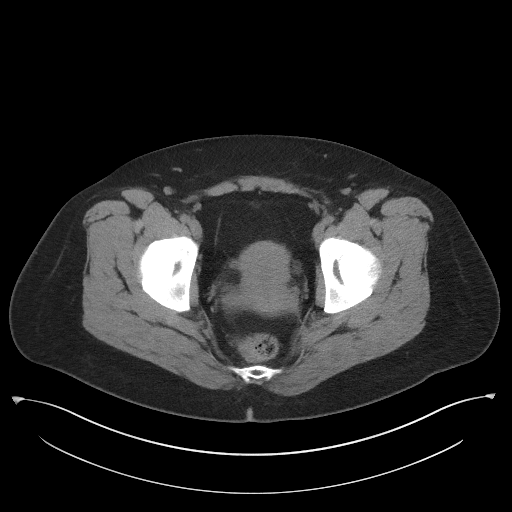
[im 30/102  soft-tissue]
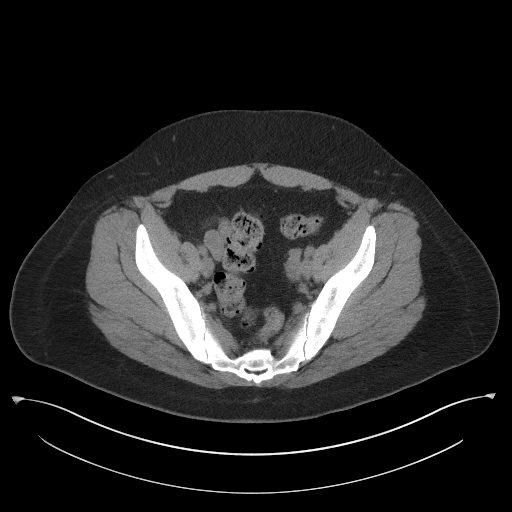
[im 34/102  soft-tissue]
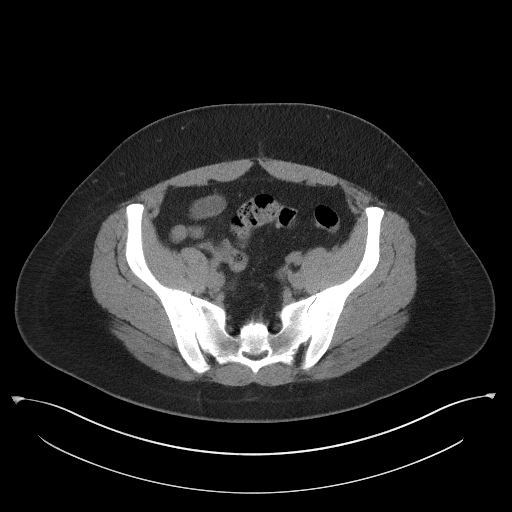
[im 43/102  soft-tissue]
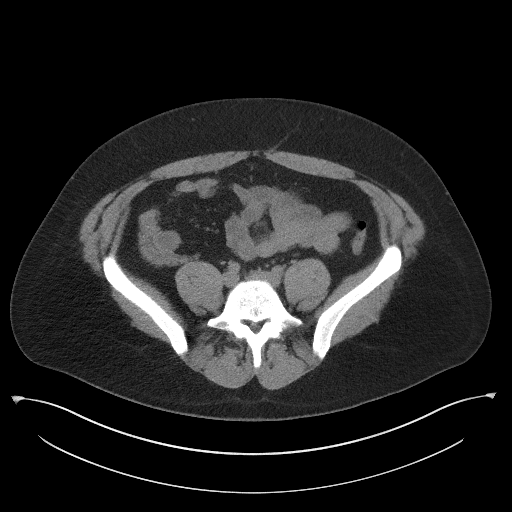
[im 51/102  soft-tissue]
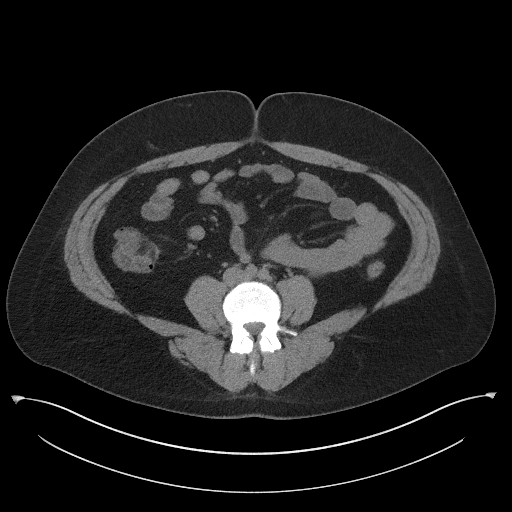
[im 59/102  soft-tissue]
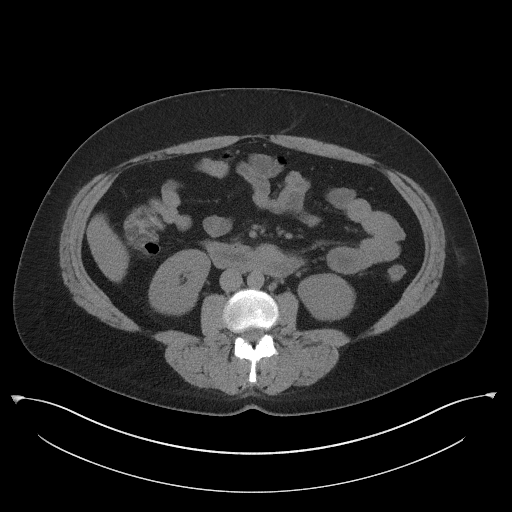
[im 68/102  soft-tissue]
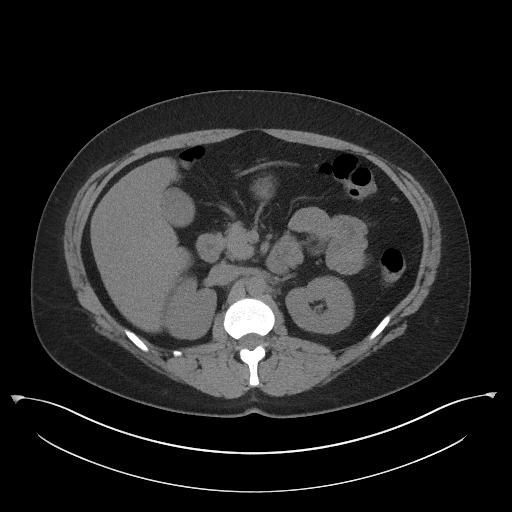
[im 68/102  bone]
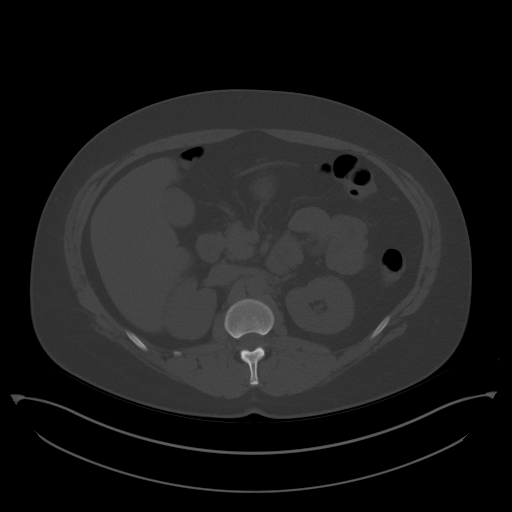
[im 72/102  soft-tissue]
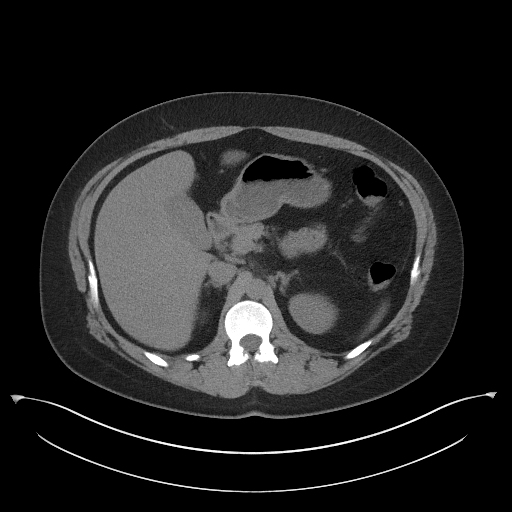
[im 80/102  soft-tissue]
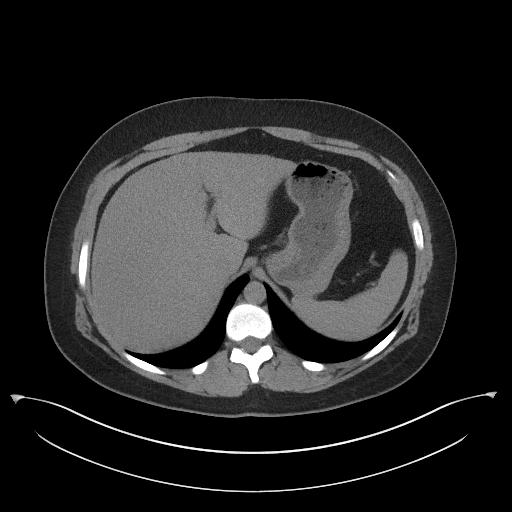
[im 89/102  soft-tissue]
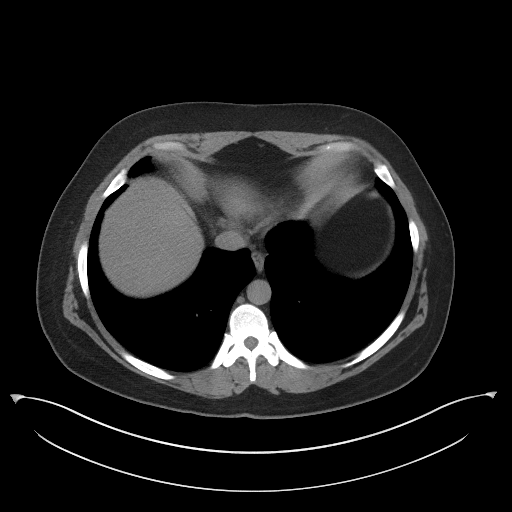
[im 97/102  soft-tissue]
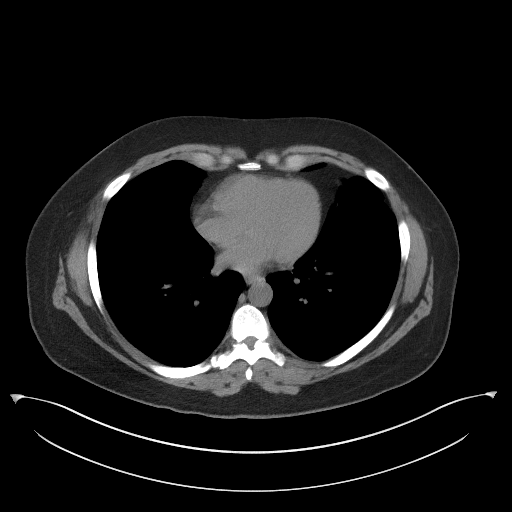

[Series 5: coronal · coronal · 0.86mm/px · 3 of 152 slices shown]
[im 51/152  soft-tissue]
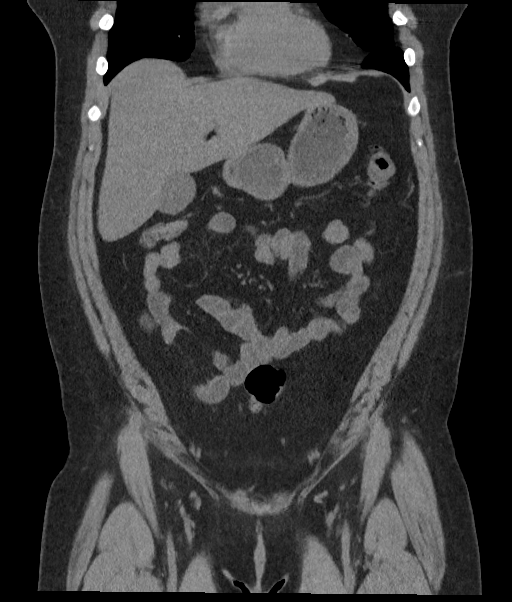
[im 68/152  soft-tissue]
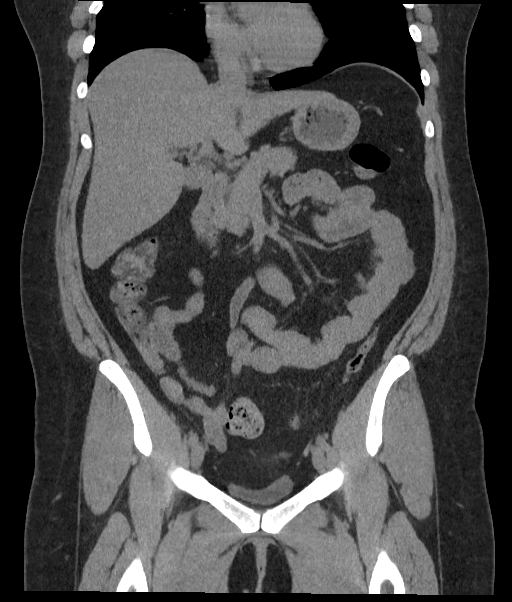
[im 84/152  soft-tissue]
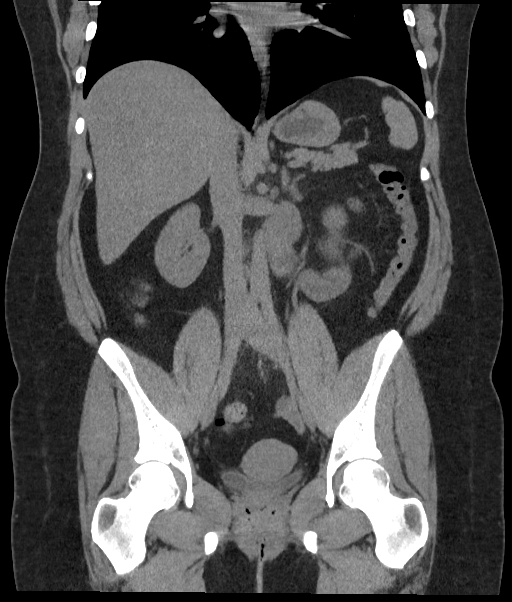

[16 of 46 positions shown; findings below may reference images not displayed]

FINDINGS: Lower chest: The lung bases are clear. No pleural fluid or
pneumothorax.

Hepatobiliary: Mild diffusely decreased hepatic density consistent
with steatosis. No evidence of focal lesion on noncontrast exam.
Gallbladder physiologically distended, no calcified stone. No
biliary dilatation.

Pancreas: No ductal dilatation or inflammation.

Spleen: Normal in size without focal abnormality.

Adrenals/Urinary Tract: Normal adrenal glands. No hydronephrosis. No
perinephric edema. No renal calculi. No evidence of focal renal
mass. Both ureters are decompressed without stones along the course.
The urinary bladder is nondistended and not well evaluated. No
bladder stone.

Stomach/Bowel: Stomach is unremarkable. There is no small bowel
obstruction or evidence of inflammation. Normal appendix. Mild
submucosal fatty infiltration involving the ascending colon. No
colonic wall thickening or acute inflammatory change. Small volume
of colonic stool.

Vascular/Lymphatic: Normal caliber abdominal aorta. Occasional
periportal nodes are likely reactive, not enlarged by size criteria.
No suspicious adenopathy.

Reproductive: Posterior left uterus slightly bulbous and may
represent underlying fibroids. Symmetric sized ovaries without
adnexal mass.

Other: No free air or free fluid.  No abdominal wall hernia.

Musculoskeletal: Degenerative disc disease at L5-S1 with disc space
narrowing, endplate spurring, and mild endplate sclerosis. No pars
defects. There are no acute or suspicious osseous abnormalities.
IMPRESSION: 1. No renal stones or obstructive uropathy. No acute findings in the
abdomen/pelvis.
2. Mild hepatic steatosis.
3. Possible small uterine fibroid.
4. Degenerative disc disease at L5-S1.

## 2022-07-23 DIAGNOSIS — Z419 Encounter for procedure for purposes other than remedying health state, unspecified: Secondary | ICD-10-CM | POA: Diagnosis not present

## 2022-08-23 DIAGNOSIS — Z419 Encounter for procedure for purposes other than remedying health state, unspecified: Secondary | ICD-10-CM | POA: Diagnosis not present

## 2022-09-23 DIAGNOSIS — Z419 Encounter for procedure for purposes other than remedying health state, unspecified: Secondary | ICD-10-CM | POA: Diagnosis not present

## 2022-10-22 DIAGNOSIS — Z419 Encounter for procedure for purposes other than remedying health state, unspecified: Secondary | ICD-10-CM | POA: Diagnosis not present

## 2022-11-22 DIAGNOSIS — Z419 Encounter for procedure for purposes other than remedying health state, unspecified: Secondary | ICD-10-CM | POA: Diagnosis not present

## 2022-12-22 DIAGNOSIS — Z419 Encounter for procedure for purposes other than remedying health state, unspecified: Secondary | ICD-10-CM | POA: Diagnosis not present

## 2023-01-22 DIAGNOSIS — Z419 Encounter for procedure for purposes other than remedying health state, unspecified: Secondary | ICD-10-CM | POA: Diagnosis not present

## 2023-02-21 DIAGNOSIS — Z419 Encounter for procedure for purposes other than remedying health state, unspecified: Secondary | ICD-10-CM | POA: Diagnosis not present

## 2023-03-24 DIAGNOSIS — Z419 Encounter for procedure for purposes other than remedying health state, unspecified: Secondary | ICD-10-CM | POA: Diagnosis not present

## 2023-04-24 DIAGNOSIS — Z419 Encounter for procedure for purposes other than remedying health state, unspecified: Secondary | ICD-10-CM | POA: Diagnosis not present

## 2023-05-24 DIAGNOSIS — Z419 Encounter for procedure for purposes other than remedying health state, unspecified: Secondary | ICD-10-CM | POA: Diagnosis not present

## 2023-06-24 DIAGNOSIS — Z419 Encounter for procedure for purposes other than remedying health state, unspecified: Secondary | ICD-10-CM | POA: Diagnosis not present

## 2023-07-24 DIAGNOSIS — Z419 Encounter for procedure for purposes other than remedying health state, unspecified: Secondary | ICD-10-CM | POA: Diagnosis not present

## 2023-08-24 DIAGNOSIS — Z419 Encounter for procedure for purposes other than remedying health state, unspecified: Secondary | ICD-10-CM | POA: Diagnosis not present

## 2023-09-24 DIAGNOSIS — Z419 Encounter for procedure for purposes other than remedying health state, unspecified: Secondary | ICD-10-CM | POA: Diagnosis not present

## 2023-10-22 DIAGNOSIS — Z419 Encounter for procedure for purposes other than remedying health state, unspecified: Secondary | ICD-10-CM | POA: Diagnosis not present

## 2023-12-03 DIAGNOSIS — Z419 Encounter for procedure for purposes other than remedying health state, unspecified: Secondary | ICD-10-CM | POA: Diagnosis not present

## 2024-01-02 DIAGNOSIS — Z419 Encounter for procedure for purposes other than remedying health state, unspecified: Secondary | ICD-10-CM | POA: Diagnosis not present

## 2024-02-02 DIAGNOSIS — Z419 Encounter for procedure for purposes other than remedying health state, unspecified: Secondary | ICD-10-CM | POA: Diagnosis not present

## 2024-03-03 DIAGNOSIS — Z419 Encounter for procedure for purposes other than remedying health state, unspecified: Secondary | ICD-10-CM | POA: Diagnosis not present

## 2024-04-03 DIAGNOSIS — Z419 Encounter for procedure for purposes other than remedying health state, unspecified: Secondary | ICD-10-CM | POA: Diagnosis not present

## 2024-05-04 DIAGNOSIS — Z419 Encounter for procedure for purposes other than remedying health state, unspecified: Secondary | ICD-10-CM | POA: Diagnosis not present

## 2024-07-04 DIAGNOSIS — Z419 Encounter for procedure for purposes other than remedying health state, unspecified: Secondary | ICD-10-CM | POA: Diagnosis not present
